# Patient Record
Sex: Female | Born: 1965 | Smoking: Never smoker
Health system: Southern US, Community
[De-identification: ages and names within clinical notes are randomized; demographics above are authoritative.]

## PROBLEM LIST (undated history)

## (undated) DIAGNOSIS — E079 Disorder of thyroid, unspecified: Secondary | ICD-10-CM

## (undated) DIAGNOSIS — F419 Anxiety disorder, unspecified: Secondary | ICD-10-CM

## (undated) DIAGNOSIS — R87629 Unspecified abnormal cytological findings in specimens from vagina: Secondary | ICD-10-CM

## (undated) DIAGNOSIS — F32A Depression, unspecified: Secondary | ICD-10-CM

## (undated) DIAGNOSIS — F329 Major depressive disorder, single episode, unspecified: Secondary | ICD-10-CM

## (undated) HISTORY — PX: SHOULDER SURGERY: SHX246

## (undated) HISTORY — DX: Unspecified abnormal cytological findings in specimens from vagina: R87.629

## (undated) HISTORY — PX: TUBAL LIGATION: SHX77

## (undated) HISTORY — DX: Disorder of thyroid, unspecified: E07.9

## (undated) HISTORY — DX: Depression, unspecified: F32.A

## (undated) HISTORY — PX: ABLATION: SHX5711

## (undated) HISTORY — DX: Anxiety disorder, unspecified: F41.9

---
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
## 2019-04-20 DIAGNOSIS — E039 Hypothyroidism, unspecified: Secondary | ICD-10-CM

## 2019-04-20 DIAGNOSIS — R1031 Right lower quadrant pain: Secondary | ICD-10-CM | POA: Diagnosis not present

## 2019-04-20 DIAGNOSIS — Z7689 Persons encountering health services in other specified circumstances: Secondary | ICD-10-CM

## 2019-04-20 DIAGNOSIS — E559 Vitamin D deficiency, unspecified: Secondary | ICD-10-CM

## 2019-04-20 DIAGNOSIS — Z1231 Encounter for screening mammogram for malignant neoplasm of breast: Secondary | ICD-10-CM | POA: Diagnosis not present

## 2019-04-20 DIAGNOSIS — R1032 Left lower quadrant pain: Secondary | ICD-10-CM

## 2019-04-20 DIAGNOSIS — Z8639 Personal history of other endocrine, nutritional and metabolic disease: Secondary | ICD-10-CM

## 2019-04-20 DIAGNOSIS — F418 Other specified anxiety disorders: Secondary | ICD-10-CM

## 2019-04-20 DIAGNOSIS — Z0000 Encounter for general adult medical examination without abnormal findings: Secondary | ICD-10-CM

## 2019-04-20 NOTE — Assessment & Plan Note (Signed)
Dicyclomine 10 mg 3 times daily before meals as needed.  Due for colonoscopy.  Refer to GI.

## 2019-04-20 NOTE — Assessment & Plan Note (Addendum)
Continue levothyroxine 150 mg daily. Checking TSH today.

## 2019-04-20 NOTE — Assessment & Plan Note (Signed)
Checking vitamin D level.  

## 2019-04-20 NOTE — Progress Notes (Signed)
New Patient Office Visit  Subjective:  Patient ID: Rebecca Olsen, female    DOB: 16-Aug-1965  Age: 54 y.o. MRN: [id number]  CC:  Chief Complaint  Patient presents with  . Establish Care  . Abdominal Pain    HPI [name] presents to establish care and discuss lower abdominal pain/cramping.  Establish care: Patient recently moved from [place] to [place].  We have requested records from her prior provider.  Anxiety/depression: Her husband passed away last 10-17-2022 and she reports an increase in anxiety since then.  Taking Seroquel 300 mg XR nightly.  Has some days with increased anxiety and others where she is fine.  Feels that she would like to have medication on hand to take as needed for anxiety. No SI/HI.  Hypothyroidism: Reports that she was told once that she has Hashimoto's thyroiditis.  Taking levothyroxine 150 mcg daily.  Unsure when last TSH was checked.  Abdominal pain/cramping: Complains of lower abdomen pain and cramping approximately 10 to 15 minutes after eating, accompanied by nausea, starting after her husband passed away last summer.  Significant nausea after eating but no vomiting.  Pain described as 5 out of 10, cramping in bilateral lower quadrants.  Has tried over-the-counter Imodium, taking up to 6 doses per day, mild to moderate relief.  No history of GERD.  Reporting at least 3 loose stools per day.  Small amount of bright red blood noticed yesterday and the day before but none today.  No appetite changes but does skip breakfast, snacks at lunch, and eats a full dinner.  Past Medical History:  Diagnosis Date  . Anxiety   . Depression     Past Surgical History:  Procedure Laterality Date  . ABLATION    . SHOULDER SURGERY Bilateral   . TUBAL LIGATION      Family History  Problem Relation Age of Onset  . Diabetes Mother   . Diabetes Father   . Hypertension Maternal Grandmother   . Diabetes Maternal Grandmother   . Hypertension Maternal Grandfather    . Heart attack Maternal Grandfather   . Diabetes Maternal Grandfather   . Hypertension Paternal Grandmother   . Diabetes Paternal Grandmother   . Hypertension Paternal Grandfather   . Diabetes Paternal Grandfather   . Stroke Paternal Grandfather   . Cancer Paternal Grandfather     Social History   Socioeconomic History  . Marital status: Widowed    Spouse name: Not on file  . Number of children: Not on file  . Years of education: Not on file  . Highest education level: Not on file  Occupational History  . Occupation: Librarian, academic  Tobacco Use  . Smoking status: Never Smoker  . Smokeless tobacco: Never Used  Substance and Sexual Activity  . Alcohol use: Yes    Alcohol/week: 1.0 - 2.0 standard drinks    Types: 1 - 2 Shots of liquor per week  . Drug use: Never  . Sexual activity: Yes    Partners: Male    Birth control/protection: Surgical    Comment: tubal ligation, ablation  Other Topics Concern  . Not on file  Social History Narrative  . Not on file   Social Determinants of Health   Financial Resource Strain:   . Difficulty of Paying Living Expenses: Not on file  Food Insecurity:   . Worried About Charity fundraiser in the Last Year: Not on file  . Ran Out of Food in the Last Year: Not on file  Transportation  Needs:   . Lack of Transportation (Medical): Not on file  . Lack of Transportation (Non-Medical): Not on file  Physical Activity:   . Days of Exercise per Week: Not on file  . Minutes of Exercise per Session: Not on file  Stress:   . Feeling of Stress : Not on file  Social Connections:   . Frequency of Communication with Friends and Family: Not on file  . Frequency of Social Gatherings with Friends and Family: Not on file  . Attends Religious Services: Not on file  . Active Member of Clubs or Organizations: Not on file  . Attends Archivist Meetings: Not on file  . Marital Status: Not on file  Intimate Partner Violence:   . Fear of Current  or Ex-Partner: Not on file  . Emotionally Abused: Not on file  . Physically Abused: Not on file  . Sexually Abused: Not on file    ROS Review of Systems  Constitutional: Negative for chills, fatigue, fever and unexpected weight change.  HENT: Negative for congestion, rhinorrhea and sore throat.   Respiratory: Negative for cough, chest tightness, shortness of breath and wheezing.   Cardiovascular: Negative for chest pain, palpitations and leg swelling.  Gastrointestinal: Positive for abdominal distention, abdominal pain, blood in stool, diarrhea and nausea. Negative for vomiting.  Genitourinary: Negative for dysuria, frequency and urgency.  Neurological: Negative for dizziness, light-headedness and headaches.  Psychiatric/Behavioral: Negative for self-injury, sleep disturbance and suicidal ideas. The patient is nervous/anxious.     Objective:   Today's Vitals: BP (!) 144/87   Pulse 85   Temp 98.4 F (36.9 C) (Oral)   Ht 5\' 1"  (1.549 m)   Wt 126 lb 12.8 oz (57.5 kg)   SpO2 100%   BMI 23.96 kg/m   Physical Exam Vitals reviewed.  Constitutional:      General: She is not in acute distress.    Appearance: Normal appearance.  HENT:     Head: Normocephalic and atraumatic.  Cardiovascular:     Rate and Rhythm: Normal rate and regular rhythm.     Pulses: Normal pulses.     Heart sounds: Normal heart sounds. No murmur. No friction rub. No gallop.   Pulmonary:     Effort: Pulmonary effort is normal. No respiratory distress.     Breath sounds: Normal breath sounds.  Abdominal:     General: Bowel sounds are normal.     Palpations: Abdomen is soft.  Skin:    General: Skin is warm and dry.  Neurological:     Mental Status: She is alert and oriented to person, place, and time.  Psychiatric:        Mood and Affect: Mood normal.        Behavior: Behavior normal.        Thought Content: Thought content normal.        Judgment: Judgment normal.     Assessment & Plan:   Problem  List Items Addressed This Visit      Endocrine   Hypothyroidism    Continue levothyroxine 150 mg daily. Checking TSH today.      Relevant Orders   TSH     Other   Bilateral lower abdominal cramping    Dicyclomine 10 mg 3 times daily before meals as needed.  Due for colonoscopy.  Refer to GI.      Relevant Medications   dicyclomine (BENTYL) 10 MG capsule   Other Relevant Orders   CBC   Ambulatory referral  to Gastroenterology   Encounter for screening mammogram for malignant neoplasm of breast    Mammogram ordered      Relevant Orders   MM 3D SCREEN BREAST BILATERAL   Preventative health care    Checking CBC, CMP, lipids.  We will plan to return for Pap smear/CPE.      Relevant Orders   CBC   COMPLETE METABOLIC PANEL WITH GFR   Lipid panel   History of vitamin D deficiency    Checking vitamin D level.      Relevant Orders   VITAMIN D 25 Hydroxy (Vit-D Deficiency, Fractures)   Depression with anxiety    Continue Seroquel nightly.  Will discuss adjuncts to address anxiety once lab results are back.       Other Visit Diagnoses    Encounter to establish care    -  Primary      Outpatient Encounter Medications as of 04/20/2019  Medication Sig  . levothyroxine (SYNTHROID) 150 MCG tablet Take 150 mcg by mouth daily before breakfast.  . QUEtiapine (SEROQUEL XR) 300 MG 24 hr tablet Take 300 mg by mouth at bedtime.  . dicyclomine (BENTYL) 10 MG capsule Take 1 capsule (10 mg total) by mouth 3 (three) times daily before meals.   No facility-administered encounter medications on file as of 04/20/2019.    Follow-up: Return in about 6 weeks (around 06/01/2019) for CPE/Pap smear.   Clearnce Sorrel, DNP, APRN, FNP-BC [facility]

## 2019-04-20 NOTE — Assessment & Plan Note (Signed)
Continue Seroquel nightly.  Will discuss adjuncts to address anxiety once lab results are back.

## 2019-04-20 NOTE — Assessment & Plan Note (Signed)
Mammogram ordered

## 2019-04-20 NOTE — Assessment & Plan Note (Signed)
Checking CBC, CMP, lipids.  We will plan to return for Pap smear/CPE.

## 2019-04-23 DIAGNOSIS — F418 Other specified anxiety disorders: Secondary | ICD-10-CM

## 2019-04-23 NOTE — Addendum Note (Signed)
Addended bySamuel Bouche on: 04/23/2019 08:14 AM   Modules accepted: Orders

## 2019-04-23 NOTE — Telephone Encounter (Signed)
Received fax from [employer] that Seroquel requires a PA. Information has been sent to the insurance company. Awaiting determination.

## 2019-04-24 NOTE — Telephone Encounter (Signed)
Received fax for Medication exception on Quetiapine ER  It has been approved as of 04/22/20.  Valid: 04/23/19 - 04/22/20 - CF

## 2019-05-02 DIAGNOSIS — R197 Diarrhea, unspecified: Secondary | ICD-10-CM

## 2019-05-02 DIAGNOSIS — K921 Melena: Secondary | ICD-10-CM

## 2019-05-02 DIAGNOSIS — R110 Nausea: Secondary | ICD-10-CM

## 2019-05-02 DIAGNOSIS — R109 Unspecified abdominal pain: Secondary | ICD-10-CM

## 2019-05-02 DIAGNOSIS — Z01818 Encounter for other preprocedural examination: Secondary | ICD-10-CM | POA: Diagnosis not present

## 2019-05-02 DIAGNOSIS — E559 Vitamin D deficiency, unspecified: Secondary | ICD-10-CM

## 2019-05-02 DIAGNOSIS — R1030 Lower abdominal pain, unspecified: Secondary | ICD-10-CM | POA: Diagnosis not present

## 2019-05-02 DIAGNOSIS — R194 Change in bowel habit: Secondary | ICD-10-CM

## 2019-05-02 NOTE — Patient Instructions (Addendum)
You have been scheduled for an endoscopy and colonoscopy. Please follow the written instructions given to you at your visit today. Please pick up your prep supplies at the pharmacy within the next 1-3 days. If you use inhalers (even only as needed), please bring them with you on the day of your procedure. Your physician has requested that you go to www.startemmi.com and enter the access code given to you at your visit today. This web site gives a general overview about your procedure. However, you should still follow specific instructions given to you by our office regarding your preparation for the procedure.  Your provider has requested that you go to the basement level for lab work at [facility] in [place] [address]. Press "B" on the elevator. The lab is located at the first door on the left as you exit the elevator.  It was a pleasure to see you today!  Vito Cirigliano, D.O.

## 2019-05-04 DIAGNOSIS — R21 Rash and other nonspecific skin eruption: Secondary | ICD-10-CM | POA: Diagnosis not present

## 2019-05-04 DIAGNOSIS — R350 Frequency of micturition: Secondary | ICD-10-CM

## 2019-05-04 DIAGNOSIS — R300 Dysuria: Secondary | ICD-10-CM

## 2019-05-04 DIAGNOSIS — R197 Diarrhea, unspecified: Secondary | ICD-10-CM

## 2019-05-04 NOTE — Telephone Encounter (Signed)
Sarie called and left a message stating she has a rash on her stomach. I called and left a message for patient to call back to schedule a virtual visit.

## 2019-05-07 NOTE — Telephone Encounter (Signed)
Rebecca Olsen called and left a message stating she was wanting to stop taking one of her medications. She would like to stop the Seroquel and start a medication for insomnia. She rather not take a depression medication for sleep.

## 2019-05-08 DIAGNOSIS — G4700 Insomnia, unspecified: Secondary | ICD-10-CM

## 2019-05-15 DIAGNOSIS — Z1159 Encounter for screening for other viral diseases: Secondary | ICD-10-CM

## 2019-05-18 DIAGNOSIS — K635 Polyp of colon: Secondary | ICD-10-CM

## 2019-05-18 DIAGNOSIS — K640 First degree hemorrhoids: Secondary | ICD-10-CM

## 2019-05-18 DIAGNOSIS — D122 Benign neoplasm of ascending colon: Secondary | ICD-10-CM

## 2019-05-18 DIAGNOSIS — R110 Nausea: Secondary | ICD-10-CM

## 2019-05-18 DIAGNOSIS — R1030 Lower abdominal pain, unspecified: Secondary | ICD-10-CM | POA: Diagnosis present

## 2019-05-18 DIAGNOSIS — K2951 Unspecified chronic gastritis with bleeding: Secondary | ICD-10-CM | POA: Diagnosis not present

## 2019-05-18 DIAGNOSIS — K2970 Gastritis, unspecified, without bleeding: Secondary | ICD-10-CM | POA: Diagnosis not present

## 2019-05-18 DIAGNOSIS — R197 Diarrhea, unspecified: Secondary | ICD-10-CM | POA: Diagnosis not present

## 2019-05-18 DIAGNOSIS — E559 Vitamin D deficiency, unspecified: Secondary | ICD-10-CM

## 2019-05-18 DIAGNOSIS — K921 Melena: Secondary | ICD-10-CM

## 2019-05-18 DIAGNOSIS — K6389 Other specified diseases of intestine: Secondary | ICD-10-CM | POA: Diagnosis not present

## 2019-05-18 NOTE — Op Note (Signed)
[facility] Patient Name: Rebecca Olsen Procedure Date: 05/18/2019 3:02 PM MRN: [id number] Endoscopist: Gerrit Heck , MD Age: 54 Referring MD:  Date of Birth: 08/27/1965 Gender: Female Account #: [id number] Procedure:                Upper GI endoscopy Indications:              Lower abdominal pain/cramping, Diarrhea, Nausea,                            Vitamin D deficiency and prior history of iron                            deficiency without anemia. Medicines:                Monitored Anesthesia Care Procedure:                Pre-Anesthesia Assessment:                           - Prior to the procedure, a History and Physical                            was performed, and patient medications and                            allergies were reviewed. The patient's tolerance of                            previous anesthesia was also reviewed. The risks                            and benefits of the procedure and the sedation                            options and risks were discussed with the patient.                            All questions were answered, and informed consent                            was obtained. Prior Anticoagulants: The patient has                            taken no previous anticoagulant or antiplatelet                            agents. ASA Grade Assessment: II - A patient with                            mild systemic disease. After reviewing the risks                            and benefits, the patient was deemed in  satisfactory condition to undergo the procedure.                           After obtaining informed consent, the endoscope was                            passed under direct vision. Throughout the                            procedure, the patient's blood pressure, pulse, and                            oxygen saturations were monitored continuously. The                            Endoscope was introduced through  the mouth, and                            advanced to the second part of duodenum. The upper                            GI endoscopy was accomplished without difficulty.                            The patient tolerated the procedure well. Scope In: Scope Out: Findings:                 The examined esophagus was normal.                           Scattered minimal inflammation characterized by                            erythema was found in the gastric body and in the                            gastric antrum. Biopsies were taken with a cold                            forceps for Helicobacter pylori testing. Estimated                            blood loss was minimal.                           The duodenal bulb, first portion of the duodenum                            and second portion of the duodenum were normal.                            Biopsies for histology were taken with a cold  forceps for evaluation of celiac disease. Estimated                            blood loss was minimal. Complications:            No immediate complications. Estimated Blood Loss:     Estimated blood loss was minimal. Impression:               - Normal esophagus.                           - Gastritis. Biopsied.                           - Normal duodenal bulb, first portion of the                            duodenum and second portion of the duodenum.                            Biopsied. Recommendation:           - Patient has a contact number available for                            emergencies. The signs and symptoms of potential                            delayed complications were discussed with the                            patient. Return to normal activities tomorrow.                            Written discharge instructions were provided to the                            patient.                           - Resume previous diet.                           - Continue  present medications.                           - Await pathology results.                           - Perform a colonoscopy today. Gerrit Heck, MD 05/18/2019 3:55:55 PM

## 2019-05-18 NOTE — Progress Notes (Signed)
A and O x3. Report to RN. Tolerated MAC anesthesia well.Teeth unchanged after procedure.

## 2019-05-18 NOTE — Op Note (Signed)
[facility] Patient Name: Rebecca Olsen Procedure Date: 05/18/2019 3:01 PM MRN: [id number] Endoscopist: Gerrit Heck , MD Age: 54 Referring MD:  Date of Birth: 02-17-1966 Gender: Female Account #: [id number] Procedure:                Colonoscopy Indications:              Lower abdominal pain/Cramping, Hematochezia,                            Diarrhea, Hx of iron deficiency without anemia Medicines:                Monitored Anesthesia Care Procedure:                Pre-Anesthesia Assessment:                           - Prior to the procedure, a History and Physical                            was performed, and patient medications and                            allergies were reviewed. The patient's tolerance of                            previous anesthesia was also reviewed. The risks                            and benefits of the procedure and the sedation                            options and risks were discussed with the patient.                            All questions were answered, and informed consent                            was obtained. Prior Anticoagulants: The patient has                            taken no previous anticoagulant or antiplatelet                            agents. ASA Grade Assessment: II - A patient with                            mild systemic disease. After reviewing the risks                            and benefits, the patient was deemed in                            satisfactory condition to undergo the procedure.  After obtaining informed consent, the colonoscope                            was passed under direct vision. Throughout the                            procedure, the patient's blood pressure, pulse, and                            oxygen saturations were monitored continuously. The                            Colonoscope was introduced through the anus and                            advanced to the the  terminal ileum. The colonoscopy                            was performed without difficulty. The patient                            tolerated the procedure well. The quality of the                            bowel preparation was adequate. The terminal ileum,                            ileocecal valve, appendiceal orifice, and rectum                            were photographed. Scope In: 3:17:02 PM Scope Out: 3:33:17 PM Scope Withdrawal Time: 0 hours 13 minutes 19 seconds  Total Procedure Duration: 0 hours 16 minutes 15 seconds  Findings:                 The perianal and digital rectal examinations were                            normal.                           A 3 mm polyp was found in the ascending colon. The                            polyp was sessile. The polyp was removed with a                            cold biopsy forceps. Resection and retrieval were                            complete. Estimated blood loss was minimal.                           Non-bleeding internal hemorrhoids were found during  anoscopy. The hemorrhoids were small.                           Retroflexion in the rectum was not performed due to                            anatomy (narrow, short rectal vault). Anterograde                            views on slow withdrawal were otherwise normal                            appearing.                           Normal mucosa was found in the remainder of the                            colon. Biopsies for histology were taken with a                            cold forceps from the right colon and left colon                            for evaluation of microscopic colitis. Estimated                            blood loss was minimal.                           The terminal ileum appeared normal. Complications:            No immediate complications. Estimated Blood Loss:     Estimated blood loss was minimal. Impression:               - One 3  mm polyp in the ascending colon, removed                            with a cold biopsy forceps. Resected and retrieved.                           - Non-bleeding internal hemorrhoids.                           - Normal mucosa in the entire examined colon.                            Biopsied.                           - The examined portion of the ileum was normal. Recommendation:           - Patient has a contact number available for                            emergencies. The signs  and symptoms of potential                            delayed complications were discussed with the                            patient. Return to normal activities tomorrow.                            Written discharge instructions were provided to the                            patient.                           - Resume previous diet.                           - Continue present medications.                           - Await pathology results.                           - Repeat colonoscopy in 7-10 years for surveillance                            based on pathology results.                           - Return to GI office PRN.                           - Use fiber, for example Citrucel, Fibercon, Konsyl                            or Metamucil. Gerrit Heck, MD 05/18/2019 4:00:13 PM

## 2019-05-18 NOTE — Patient Instructions (Signed)
YOU HAD AN ENDOSCOPIC PROCEDURE TODAY AT THE [facility]:   Refer to the procedure report that was given to you for any specific questions about what was found during the examination.  If the procedure report does not answer your questions, please call your gastroenterologist to clarify.  If you requested that your care partner not be given the details of your procedure findings, then the procedure report has been included in a sealed envelope for you to review at your convenience later.  YOU SHOULD EXPECT: Some feelings of bloating in the abdomen. Passage of more gas than usual.  Walking can help get rid of the air that was put into your GI tract during the procedure and reduce the bloating. If you had a lower endoscopy (such as a colonoscopy or flexible sigmoidoscopy) you may notice spotting of blood in your stool or on the toilet paper. If you underwent a bowel prep for your procedure, you may not have a normal bowel movement for a few days.  Please Note:  You might notice some irritation and congestion in your nose or some drainage.  This is from the oxygen used during your procedure.  There is no need for concern and it should clear up in a day or so.  SYMPTOMS TO REPORT IMMEDIATELY:   Following lower endoscopy (colonoscopy or flexible sigmoidoscopy):  Excessive amounts of blood in the stool  Significant tenderness or worsening of abdominal pains  Swelling of the abdomen that is new, acute  Fever of 100F or higher   Following upper endoscopy (EGD)  Vomiting of blood or coffee ground material  New chest pain or pain under the shoulder blades  Painful or persistently difficult swallowing  New shortness of breath  Fever of 100F or higher  Black, tarry-looking stools  For urgent or emergent issues, a gastroenterologist can be reached at any hour by calling [phone]. Do not use MyChart messaging for urgent concerns.    DIET:  We do recommend a small meal at first, but  then you may proceed to your regular diet.  Drink plenty of fluids but you should avoid alcoholic beverages for 24 hours.  ACTIVITY:  You should plan to take it easy for the rest of today and you should NOT DRIVE or use heavy machinery until tomorrow (because of the sedation medicines used during the test).    FOLLOW UP: Our staff will call the number listed on your records 48-72 hours following your procedure to check on you and address any questions or concerns that you may have regarding the information given to you following your procedure. If we do not reach you, we will leave a message.  We will attempt to reach you two times.  During this call, we will ask if you have developed any symptoms of COVID 19. If you develop any symptoms (ie: fever, flu-like symptoms, shortness of breath, cough etc.) before then, please call [phone].  If you test positive for Covid 19 in the 2 weeks post procedure, please call and report this information to us.    If any biopsies were taken you will be contacted by phone or by letter within the next 1-3 weeks.  Please call us at [phone] if you have not heard about the biopsies in 3 weeks.    SIGNATURES/CONFIDENTIALITY: You and/or your care partner have signed paperwork which will be entered into your electronic medical record.  These signatures attest to the fact that that the information above on   your After Visit Summary has been reviewed and is understood.  Full responsibility of the confidentiality of this discharge information lies with you and/or your care-partner. 

## 2019-05-18 NOTE — Progress Notes (Signed)
Called to room to assist during endoscopic procedure.  Patient ID and intended procedure confirmed with present staff. Received instructions for my participation in the procedure from the performing physician.  

## 2019-05-22 NOTE — Telephone Encounter (Signed)
  Follow up Call-  Call back number 05/18/2019  Post procedure Call Back phone  # [phone]  Permission to leave phone message Yes     Patient questions:  Do you have a fever, pain , or abdominal swelling? No. Pain Score  0 *  Have you tolerated food without any problems? Yes.    Have you been able to return to your normal activities? Yes.    Do you have any questions about your discharge instructions: Diet   No. Medications  No. Follow up visit  No.  Do you have questions or concerns about your Care? No.  Actions: * If pain score is 4 or above: 1. No action needed, pain <4.Have you developed a fever since your procedure? no  2.   Have you had an respiratory symptoms (SOB or cough) since your procedure? no  3.   Have you tested positive for COVID 19 since your procedure no  4.   Have you had any family members/close contacts diagnosed with the COVID 19 since your procedure?  no   If yes to any of these questions please route to Joylene John, RN and Alphonsa Gin, Therapist, sports.

## 2019-06-01 DIAGNOSIS — Z124 Encounter for screening for malignant neoplasm of cervix: Secondary | ICD-10-CM | POA: Diagnosis not present

## 2019-06-01 DIAGNOSIS — Z1231 Encounter for screening mammogram for malignant neoplasm of breast: Secondary | ICD-10-CM

## 2019-06-01 DIAGNOSIS — R87612 Low grade squamous intraepithelial lesion on cytologic smear of cervix (LGSIL): Secondary | ICD-10-CM

## 2019-06-01 DIAGNOSIS — Z0001 Encounter for general adult medical examination with abnormal findings: Secondary | ICD-10-CM | POA: Insufficient documentation

## 2019-06-01 DIAGNOSIS — T148XXA Other injury of unspecified body region, initial encounter: Secondary | ICD-10-CM

## 2019-06-01 NOTE — Progress Notes (Signed)
HPI: Rebecca Olsen is a 54 y.o. female who  has a past medical history of Anxiety, Depression, and Thyroid disease.  she presents to [facility] today, 06/01/19,  for chief complaint of: Annual physical exam with pap  Right shoulder-experiencing pain over superior aspect of right scapula, tender to palpation.  No upper extremity weakness or paresthesias.  Worse with exercise, slightly better with rest.  Interfering with sleep, wakes her when she rolls onto the right side.  Reports that she has cervical scoliosis according to a chiropractor she saw years ago.  Has tried ibuprofen, Aleve, Motrin with some relief.  Massage exacerbated pain due to tenderness.  Right knee-reports right knee swelling when she has been off her feet all day.  Intermittent right knee pain.  Previous evaluation showed no abnormalities.  Received physical therapy with no improvement.  GI-reports previous GI issues are much better.  No further bloody stools.  Had her colonoscopy completed and did well overall with this but does report some severe postprocedural nausea that has since resolved.  No longer taking Bentyl.  Past medical, surgical, social and family history reviewed:  Patient Active Problem List   Diagnosis Date Noted  . Insomnia 05/08/2019  . Diarrhea 05/04/2019  . Urinary frequency 05/04/2019  . Hypothyroidism 04/20/2019  . Bilateral lower abdominal cramping 04/20/2019  . Encounter for screening mammogram for malignant neoplasm of breast 04/20/2019  . Preventative health care 04/20/2019  . History of vitamin D deficiency 04/20/2019  . Depression with anxiety 04/20/2019    Past Surgical History:  Procedure Laterality Date  . ABLATION    . SHOULDER SURGERY Bilateral   . TUBAL LIGATION      Social History   Tobacco Use  . Smoking status: Never Smoker  . Smokeless tobacco: Never Used  Substance Use Topics  . Alcohol use: Yes    Alcohol/week: 1.0 - 2.0  standard drinks    Types: 1 - 2 Shots of liquor per week    Comment: Mixed drinks    Family History  Problem Relation Age of Onset  . Diabetes Mother   . Diabetes Father   . Hypertension Maternal Grandmother   . Diabetes Maternal Grandmother   . Hypertension Maternal Grandfather   . Heart attack Maternal Grandfather   . Diabetes Maternal Grandfather   . Hypertension Paternal Grandmother   . Diabetes Paternal Grandmother   . Hypertension Paternal Grandfather   . Diabetes Paternal Grandfather   . Stroke Paternal Grandfather   . Cancer Paternal Grandfather   . Colon cancer Neg Hx   . Esophageal cancer Neg Hx      Current medication list and allergy/intolerance information reviewed:    Current Outpatient Medications  Medication Sig Dispense Refill  . hydrOXYzine (ATARAX/VISTARIL) 25 MG tablet Take 1-2 tablets (25-50 mg total) by mouth at bedtime as needed (for sleep). 60 tablet 2  . levothyroxine (SYNTHROID) 175 MCG tablet Take 1 tablet (175 mcg total) by mouth daily before breakfast. 90 tablet 0  . QUEtiapine (SEROQUEL) 300 MG tablet Take 300 mg by mouth at bedtime.    . Vitamin D, Ergocalciferol, (DRISDOL) 1.25 MG (50000 UNIT) CAPS capsule Take 1 capsule (50,000 Units total) by mouth every 7 (seven) days. Take for 8 total doses(weeks) 8 capsule 0  . clotrimazole-betamethasone (LOTRISONE) cream Apply 1 application topically 2 (two) times daily. (Patient not taking: Reported on 06/01/2019) 45 g 0  . cyclobenzaprine (FLEXERIL) 10 MG tablet Take 1 tablet (10 mg total)  by mouth 3 (three) times daily as needed for muscle spasms. 30 tablet 0  . dicyclomine (BENTYL) 10 MG capsule Take 1 capsule (10 mg total) by mouth 3 (three) times daily before meals. (Patient not taking: Reported on 06/01/2019) 90 capsule 2  . meloxicam (MOBIC) 15 MG tablet Take 1 tab daily for 14 days and then 1 tab daily as needed. 30 tablet 0   No current facility-administered medications for this visit.    No Known  Allergies    Review of Systems:  Constitutional:  No  fever, no chills, No recent illness, No unintentional weight changes. No significant fatigue.   HEENT: No  headache, no vision change, no hearing change, No sore throat, No  sinus pressure  Cardiac: No  chest pain, No  pressure, No palpitations, No  Orthopnea  Respiratory:  No  shortness of breath. No  Cough  Gastrointestinal: No  abdominal pain, No  nausea, No  vomiting,  No  blood in stool, No  diarrhea, No  constipation   Musculoskeletal: + myalgia/arthralgia right shoulder, right knee  Skin: No  Rash, No other wounds/concerning lesions  Genitourinary: No  incontinence, No  abnormal genital bleeding, No abnormal genital discharge  Hem/Onc: No  easy bruising/bleeding, No  abnormal lymph node  Endocrine: No cold intolerance,  No heat intolerance. No polyuria/polydipsia/polyphagia   Neurologic: No  weakness, No  dizziness, No  slurred speech/focal weakness/facial droop  Psychiatric: No  concerns with depression, No  concerns with anxiety, No sleep problems, No mood problems  Pelvic exam: normal external genitalia, vulva, vagina, cervix, uterus and adnexa, VULVA: normal appearing vulva with no masses, tenderness or lesions, VAGINA: normal appearing vagina with normal color and discharge, no lesions, CERVIX: normal appearing cervix without discharge or lesions, UTERUS: uterus is normal size, shape, consistency and nontender, ADNEXA: normal adnexa in size, nontender and no masses, PAP: Pap smear done today, HPV test, exam chaperoned by Glennie Hawk, MA.    Exam:  BP (!) 145/81   Pulse 70   Temp 98.1 F (36.7 C) (Oral)   Ht 5' 1.5" (1.562 m)   Wt 132 lb 11.2 oz (60.2 kg)   SpO2 99%   BMI 24.67 kg/m   Constitutional: VS see above. General Appearance: alert, well-developed, well-nourished, NAD  Eyes: Normal lids and conjunctive, non-icteric sclera  Ears, Nose, Mouth, Throat: MMM, Normal external inspection  ears/nares/mouth/lips/gums. TM normal bilaterally. Pharynx/tonsils no erythema, no exudate. Nasal mucosa normal.   Neck: No masses, trachea midline. No thyroid enlargement. No tenderness/mass appreciated. No lymphadenopathy  Respiratory: Normal respiratory effort. no wheeze, no rhonchi, no rales  Cardiovascular: S1/S2 normal, no murmur, no rub/gallop auscultated. RRR. No lower extremity edema. Pedal pulse II/IV bilaterally DP and PT. No carotid bruit or JVD. No abdominal aortic bruit.  Gastrointestinal: Nontender, no masses. No hepatomegaly, no splenomegaly. No hernia appreciated. Bowel sounds normal. Rectal exam deferred.   Musculoskeletal: Gait normal. No clubbing/cyanosis of digits. + TTP superior right scapula.  Neurological: Normal balance/coordination. No tremor. No cranial nerve deficit on limited exam. Motor and sensation intact and symmetric. Cerebellar reflexes intact.   Skin: warm, dry, intact. No rash/ulcer. No concerning nevi or subq nodules on limited exam.    Psychiatric: Normal judgment/insight. Normal mood and affect. Oriented x3.    No results found for this or any previous visit (from the past 72 hour(s)).  No results found.   ASSESSMENT/PLAN:   1. Encounter for general adult medical examination with abnormal findings Up-to-date on vaccinations.  Most recent labs drawn 6 weeks ago.  We will recheck TSH today on new dose of Synthroid 175 mcg daily. - Cytology - PAP - TSH  2. Visit for screening mammogram Mammogram order in place.  Advised patient to call and reschedule mammogram at her convenience.  If necessary may need to resend referral for different location depending on schedule availability.  3. Cervical cancer screening Pap with HPV cotesting today. - Cytology - PAP  4. Muscle strain Meloxicam 1 tab daily x14 days then daily as needed.  Avoid ibuprofen/NSAIDs while taking this medication.  Flexeril 10 mg 3 times daily as needed.  May use ice, heat,  massage, and gentle stretches as tolerated.  If no improvement may benefit from following up with Dr. Darene Lamer for further evaluation. - meloxicam (MOBIC) 15 MG tablet; Take 1 tab daily for 14 days and then 1 tab daily as needed.  Dispense: 30 tablet; Refill: 0 - cyclobenzaprine (FLEXERIL) 10 MG tablet; Take 1 tablet (10 mg total) by mouth 3 (three) times daily as needed for muscle spasms.  Dispense: 30 tablet; Refill: 0   Orders Placed This Encounter  Procedures  . TSH    Meds ordered this encounter  Medications  . meloxicam (MOBIC) 15 MG tablet    Sig: Take 1 tab daily for 14 days and then 1 tab daily as needed.    Dispense:  30 tablet    Refill:  0    Order Specific Question:   Supervising Provider    Answer:   Emeterio Reeve [id number]  . cyclobenzaprine (FLEXERIL) 10 MG tablet    Sig: Take 1 tablet (10 mg total) by mouth 3 (three) times daily as needed for muscle spasms.    Dispense:  30 tablet    Refill:  0    Order Specific Question:   Supervising Provider    Answer:   Emeterio Reeve [phone]    Patient Instructions    Health Maintenance, Female Adopting a healthy lifestyle and getting preventive care are important in promoting health and wellness. Ask your health care provider about:  The right schedule for you to have regular tests and exams.  Things you can do on your own to prevent diseases and keep yourself healthy. What should I know about diet, weight, and exercise? Eat a healthy diet   Eat a diet that includes plenty of vegetables, fruits, low-fat dairy products, and lean protein.  Do not eat a lot of foods that are high in solid fats, added sugars, or sodium. Maintain a healthy weight Body mass index (BMI) is used to identify weight problems. It estimates body fat based on height and weight. Your health care provider can help determine your BMI and help you achieve or maintain a healthy weight. Get regular exercise Get regular exercise. This is one of the  most important things you can do for your health. Most adults should:  Exercise for at least 150 minutes each week. The exercise should increase your heart rate and make you sweat (moderate-intensity exercise).  Do strengthening exercises at least twice a week. This is in addition to the moderate-intensity exercise.  Spend less time sitting. Even light physical activity can be beneficial. Watch cholesterol and blood lipids Have your blood tested for lipids and cholesterol at 52 years of age, then have this test every 5 years. Have your cholesterol levels checked more often if:  Your lipid or cholesterol levels are high.  You are older than 52 years of age.  You are at high risk for heart disease. What should I know about cancer screening? Depending on your health history and family history, you may need to have cancer screening at various ages. This may include screening for:  Breast cancer.  Cervical cancer.  Colorectal cancer.  Skin cancer.  Lung cancer. What should I know about heart disease, diabetes, and high blood pressure? Blood pressure and heart disease  High blood pressure causes heart disease and increases the risk of stroke. This is more likely to develop in people who have high blood pressure readings, are of African descent, or are overweight.  Have your blood pressure checked: ? Every 3-5 years if you are 81-38 years of age. ? Every year if you are 75 years old or older. Diabetes Have regular diabetes screenings. This checks your fasting blood sugar level. Have the screening done:  Once every three years after age 10 if you are at a normal weight and have a low risk for diabetes.  More often and at a younger age if you are overweight or have a high risk for diabetes. What should I know about preventing infection? Hepatitis B If you have a higher risk for hepatitis B, you should be screened for this virus. Talk with your health care provider to find out if  you are at risk for hepatitis B infection. Hepatitis C Testing is recommended for:  Everyone born from [birth year] through 1963.  Anyone with known risk factors for hepatitis C. Sexually transmitted infections (STIs)  Get screened for STIs, including gonorrhea and chlamydia, if: ? You are sexually active and are younger than 52 years of age. ? You are older than 52 years of age and your health care provider tells you that you are at risk for this type of infection. ? Your sexual activity has changed since you were last screened, and you are at increased risk for chlamydia or gonorrhea. Ask your health care provider if you are at risk.  Ask your health care provider about whether you are at high risk for HIV. Your health care provider may recommend a prescription medicine to help prevent HIV infection. If you choose to take medicine to prevent HIV, you should first get tested for HIV. You should then be tested every 3 months for as long as you are taking the medicine. Pregnancy  If you are about to stop having your period (premenopausal) and you may become pregnant, seek counseling before you get pregnant.  Take 400 to 800 micrograms (mcg) of folic acid every day if you become pregnant.  Ask for birth control (contraception) if you want to prevent pregnancy. Osteoporosis and menopause Osteoporosis is a disease in which the bones lose minerals and strength with aging. This can result in bone fractures. If you are 62 years old or older, or if you are at risk for osteoporosis and fractures, ask your health care provider if you should:  Be screened for bone loss.  Take a calcium or vitamin D supplement to lower your risk of fractures.  Be given hormone replacement therapy (HRT) to treat symptoms of menopause. Follow these instructions at home: Lifestyle  Do not use any products that contain nicotine or tobacco, such as cigarettes, e-cigarettes, and chewing tobacco. If you need help quitting, ask  your health care provider.  Do not use street drugs.  Do not share needles.  Ask your health care provider for help if you need support or information about quitting drugs. Alcohol use  Do  not drink alcohol if: ? Your health care provider tells you not to drink. ? You are pregnant, may be pregnant, or are planning to become pregnant.  If you drink alcohol: ? Limit how much you use to 0-1 drink a day. ? Limit intake if you are breastfeeding.  Be aware of how much alcohol is in your drink. In the [place] one drink equals one 12 oz bottle of beer (355 mL), one 5 oz glass of wine (148 mL), or one 1 oz glass of hard liquor (44 mL). General instructions  Schedule regular health, dental, and eye exams.  Stay current with your vaccines.  Tell your health care provider if: ? You often feel depressed. ? You have ever been abused or do not feel safe at home. Summary  Adopting a healthy lifestyle and getting preventive care are important in promoting health and wellness.  Follow your health care provider's instructions about healthy diet, exercising, and getting tested or screened for diseases.  Follow your health care provider's instructions on monitoring your cholesterol and blood pressure. This information is not intended to replace advice given to you by your health care provider. Make sure you discuss any questions you have with your health care provider. Document Revised: 02/22/2018 Document Reviewed: 02/22/2018 Elsevier Patient Education  [place].  Follow-up plan: Return in 1 year (on 05/31/2020) for Annual physical exam with labs.  Clearnce Sorrel, DNP, APRN, FNP-BC [facility]

## 2019-06-05 NOTE — Addendum Note (Signed)
Addended bySamuel Bouche on: 06/05/2019 04:03 PM   Modules accepted: Orders

## 2019-06-13 DIAGNOSIS — J069 Acute upper respiratory infection, unspecified: Secondary | ICD-10-CM | POA: Diagnosis not present

## 2019-06-13 NOTE — Progress Notes (Signed)
Virtual Visit via Video Note  I connected with Rebecca Olsen on 06/13/19 at  8:10 AM EDT by a video enabled telemedicine application and verified that I am speaking with the correct person using two identifiers.   I discussed the limitations of evaluation and management by telemedicine and the availability of in person appointments. The patient expressed understanding and agreed to proceed.  Subjective:    CC: Upper respiratory symptoms  HPI: Pleasant 54 year old female presenting today via MyChart video visit.  She reports 3 days of upper respiratory symptoms including low-grade fever, headache, sinus pressure, sinus congestion, dry cough, and sore throat.  Has been taking DayQuil/NyQuil and OTC sinus headache medication with moderate relief.  Eating and drinking without difficulty.  2 known sick contacts in the house.  Reports gradual onset of symptoms.  Does not think that this is Covid related.   Past medical history, Surgical history, Family history not pertinant except as noted below, Social history, Allergies, and medications have been entered into the medical record, reviewed, and corrections made.   Review of Systems: No fevers, chills, night sweats, weight loss, chest pain, or shortness of breath.   Objective:    General: Speaking clearly in complete sentences without any shortness of breath.  Alert and oriented x3.  Normal judgment. No apparent acute distress.    Impression and Recommendations:    1. Viral URI with cough Recommend Covid testing.  Discussed symptomatic care using OTC remedies.  See below for list.  Work note requested and provided via [place].  If no improvement by Monday, patient instructed to call back for further evaluation.  I discussed the assessment and treatment plan with the patient. The patient was provided an opportunity to ask questions and all were answered. The patient agreed with the plan and demonstrated an understanding of the instructions.    The patient was advised to call back or seek an in-person evaluation if the symptoms worsen or if the condition fails to improve as anticipated.  Return if symptoms worsen or fail to improve.  20 minutes of non-face-to-face time was provided during this encounter.   Clearnce Sorrel, DNP, APRN, FNP-BC [facility]  Medications & Home Remedies for Upper Respiratory Illness   Note: the following list assumes no pregnancy, normal liver & kidney function and no other drug interactions. Samuel Bouche, FNP has highlighted medications which are safe for you to use, but these may not be appropriate for everyone. Always ask a pharmacist or qualified medical provider if you have any questions!    Aches/Pains, Fever, Headache OTC Acetaminophen (Tylenol) 500 mg tablets - take max 2 tablets (1000 mg) every 6 hours (4 times per day)  OTC Ibuprofen (Motrin) 200 mg tablets - take max 4 tablets (800 mg) every 6 hours*   Sinus Congestion OTC Nasal Saline if desired to rinse OTC Oxymetolazone (Afrin, others) sparing use due to rebound congestion, NEVER use in kids OTC Phenylephrine (Sudafed) 10 mg tablets every 4 hours (or the 12-hour formulation)* OTC Diphenhydramine (Benadryl) 25 mg tablets - take max 2 tablets every 4 hours   Cough & Sore Throat OTC Dextromethorphan (Robitussin, others) - cough suppressant OTC Guaifenesin (Robitussin, Mucinex, others) - expectorant (helps cough up mucus) (Dextromethorphan and Guaifenesin also come in a combination tablet/syrup) OTC Lozenges w/ Benzocaine + Menthol (Cepacol) Honey - as much as you want! Teas which "coat the throat" - look for ingredients Elm Bark, Licorice Root, Marshmallow Root  Other OTC Zinc Lozenges within 24 hours of symptoms onset - mixed evidence this shortens the duration of the common cold Don't waste your money on Vitamin C or Echinacea in acute illness - it's already too late!

## 2019-06-13 NOTE — Patient Instructions (Signed)
  Place upper respiratory infection patient instructions here.

## 2019-06-21 DIAGNOSIS — R87612 Low grade squamous intraepithelial lesion on cytologic smear of cervix (LGSIL): Secondary | ICD-10-CM | POA: Diagnosis not present

## 2019-06-21 NOTE — Progress Notes (Signed)
54 yo here for colposcopy. Patient with LGSIL 05/2019 Patient given informed consent, signed copy in the chart, time out was performed.  Placed in lithotomy position. Cervix viewed with speculum and colposcope after application of acetic acid.   Colposcopy adequate?  yes Acetowhite lesions? 8 o'clock Punctation? no Mosaicism?  no Abnormal vasculature?  no Biopsies? 8 o'clock ECC? yes  COMMENTS:  Patient was given post procedure instructions.  She will return in 2 weeks for results.  Mora Bellman, MD

## 2019-08-30 DIAGNOSIS — G44209 Tension-type headache, unspecified, not intractable: Secondary | ICD-10-CM

## 2019-08-30 DIAGNOSIS — M542 Cervicalgia: Secondary | ICD-10-CM

## 2019-08-30 DIAGNOSIS — R635 Abnormal weight gain: Secondary | ICD-10-CM | POA: Diagnosis not present

## 2019-08-30 NOTE — Progress Notes (Signed)
Subjective:    CC: headache, neck pain, weight gain  HPI: Very pleasant 54 year old female presenting today to discuss headache, neck pain, and weight gain.  Headache-she reports a severe headache that started 2 days ago located behind the eyes and described as constant, aching, and stabbing.  She reports she had nausea and photophobia/phonophobia but no vomiting, fever, vision changes, weakness/numbness, altered mental status, dizziness, or aura noted.  She notes that she has had increased stress lately between work and moving into a new home.  She and her significant other moved in together over the weekend which preempted her headache.  She reports she has never had a headache like this and felt that it may have been a migraine.  No personal or family history of migraines.  She has been taking ibuprofen 800 mg up to 3 times a day with minimal relief.  Went home from work early today and was able to take a nap for several hours.  Since she woke up she reports that her headache is better, stating that she can still feel it mildly but it just barely there.  She is unsure if she is starting to have migraines now or if this may be related to her chronic neck and shoulder pain.  Also notes that she has been having some vision changes over the past couple of years that have required her to use reading glasses.  Overdue for eye exam.  Neck/shoulder pain-long history of chronic right neck and shoulder pain.  She does have abnormal curvature of the cervical spine and this is caused her significant discomfort over the years.  She has seen a chiropractor in the past years.  No recent imaging of her neck.  Reports that the musculature of the cervical spine and upper right trapezius remain tense, tender to touch.  Range of motion of neck is limited by pain and she frequently finds herself tilting her head forward and to the left to compensate for her discomfort.  Has recently been experiencing numbness and tingling  in her hands although she does admit to having carpal tunnel and not wearing her braces regularly.  Has tried anti-inflammatories and muscle relaxers with little relief.  Weight gain-has been struggling with weight gain in the past few months.  She reports that she always feels hungry and finds herself eating frequently in between mealtimes.  She does work in [employer] so this does not help with her cravings.  She is interested in something that will reduce her cravings and appetite.  I reviewed the past medical history, family history, social history, surgical history, and allergies today and no changes were needed.  Please see the problem list section below in epic for further details.  Past Medical History: Past Medical History:  Diagnosis Date  . Anxiety   . Depression   . Thyroid disease   . Vaginal Pap smear, abnormal    Past Surgical History: Past Surgical History:  Procedure Laterality Date  . ABLATION    . SHOULDER SURGERY Bilateral   . TUBAL LIGATION     Social History: Social History   Socioeconomic History  . Marital status: Widowed    Spouse name: Not on file  . Number of children: 2  . Years of education: Not on file  . Highest education level: Not on file  Occupational History  . Occupation: Librarian, academic  Tobacco Use  . Smoking status: Never Smoker  . Smokeless tobacco: Never Used  Vaping Use  . Vaping Use:  Never used  Substance and Sexual Activity  . Alcohol use: Yes    Alcohol/week: 1.0 - 2.0 standard drink    Types: 1 - 2 Shots of liquor per week    Comment: Mixed drinks  . Drug use: Never  . Sexual activity: Yes    Partners: Male    Birth control/protection: Surgical    Comment: tubal ligation, ablation  Other Topics Concern  . Not on file  Social History Narrative  . Not on file   Social Determinants of Health   Financial Resource Strain:   . Difficulty of Paying Living Expenses:   Food Insecurity:   . Worried About Charity fundraiser in the  Last Year:   . Arboriculturist in the Last Year:   Transportation Needs:   . Film/video editor (Medical):   Marland Kitchen Lack of Transportation (Non-Medical):   Physical Activity:   . Days of Exercise per Week:   . Minutes of Exercise per Session:   Stress:   . Feeling of Stress :   Social Connections:   . Frequency of Communication with Friends and Family:   . Frequency of Social Gatherings with Friends and Family:   . Attends Religious Services:   . Active Member of Clubs or Organizations:   . Attends Archivist Meetings:   Marland Kitchen Marital Status:    Family History: Family History  Problem Relation Age of Onset  . Diabetes Mother   . Diabetes Father   . Hypertension Maternal Grandmother   . Diabetes Maternal Grandmother   . Hypertension Maternal Grandfather   . Heart attack Maternal Grandfather   . Diabetes Maternal Grandfather   . Hypertension Paternal Grandmother   . Diabetes Paternal Grandmother   . Hypertension Paternal Grandfather   . Diabetes Paternal Grandfather   . Stroke Paternal Grandfather   . Cancer Paternal Grandfather   . Colon cancer Neg Hx   . Esophageal cancer Neg Hx    Allergies: No Known Allergies Medications: See med rec.  Review of Systems: No fevers, chills, night sweats, weight loss, chest pain, or shortness of breath.   Objective:    General: Well Developed, well nourished, and in no acute distress.  Neuro: Alert and oriented x3.  HEENT: Normocephalic, atraumatic.  Skin: Warm and dry. Cardiac: Regular rate and rhythm, no murmurs rubs or gallops, no lower extremity edema.  Respiratory: Clear to auscultation bilaterally. Not using accessory muscles, speaking in full sentences. MSK: Left paraspinal muscles at the cervical level going down into the upper trapezius of the right shoulder very tense, tender to palpation, warm.  Neck range of motion limited by pain in all planes.  Impression and Recommendations:    1. Acute non intractable  tension-type headache As her headache seems to be resolving, advised the patient to monitor for future headaches with the symptoms.  Feel this may be a simple tension headache related to increased stress and moving but cannot rule out possible migraine.  Advised the patient to notify the office if she begins to have more frequent headaches and we will evaluate further and consider a migraine preventative.  In the meantime she may continue taking OTC remedies such as ibuprofen or Excedrin Migraine to see if these help.  Patient agreeable to the plan.  2. Neck pain As this has been an ongoing issue, we will do a 5-day burst of prednisone to reduce the inflammation quickly but I would really like for her to follow-up with Dr.  T in about a week for further evaluation and treatment. - DG Cervical Spine Complete; Future  3. Weight gain Discussed weight loss options.  Wellbutrin may be helpful in a situation like this but apparently the patient has tried this in the past and could not tolerate it due to her high levels of anxiety.  We will try phentermine for a few weeks to see if she is able to tolerate this.  We will do one half tab to see if that helps with her cravings and grazing habit throughout the day.  Return in about 1 week (around 09/06/2019) for for neck pain with Dr. Darene Lamer; follow up in 4 weeks with me for weight check. ___________________________________________ Clearnce Sorrel, DNP, APRN, FNP-BC Primary Care and [place]

## 2019-09-03 NOTE — Telephone Encounter (Signed)
Our appointment last week was for headache, neck pain, and weight gain. Since I will need more information to determine if an antibiotic is appropriate, she will need a visit. A virtual is fine and if she wants to do that today, you can add her to a spot on the schedule and I will work her in.

## 2019-09-03 NOTE — Telephone Encounter (Signed)
Left a message for a return call.

## 2019-09-03 NOTE — Telephone Encounter (Signed)
[place] states she has been taking NyQuil/DayQuil and OTC sinus medication. She is still having pressure in her face and congestions. She is requesting an antibiotic. I advised she would need an appointment. She states she was just had an appointment last week. I asked if she was seen for this problem and she stated no. She wanted me to send the message to [place].

## 2019-09-04 NOTE — Telephone Encounter (Signed)
LVM for pt to call to discuss.  T. Pailynn Vahey, CMA  

## 2019-09-06 NOTE — Telephone Encounter (Signed)
[facility] (3rd attempt). Unable to contact patient. Portal message sent to pt with Joy's response.

## 2019-09-10 DIAGNOSIS — M5030 Other cervical disc degeneration, unspecified cervical region: Secondary | ICD-10-CM | POA: Insufficient documentation

## 2019-09-10 NOTE — Addendum Note (Signed)
Addended by: Silverio Decamp on: 09/10/2019 02:33 PM   Modules accepted: Orders

## 2019-09-10 NOTE — Progress Notes (Addendum)
    Procedures performed today:    None.  Independent interpretation of notes and tests performed by another provider:   None.  Brief History, Exam, Impression, and Recommendations:    DDD (degenerative disc disease), cervical Raven is a 54 year old female, she has a long history of neck pain, right-sided with radiation to the right trapezius. We are going to start conservatively with gabapentin at night, formal physical therapy. She has already had prednisone from [facility], [place]. Return to see me in 6 weeks, MRI if no better, she refuses all forms of injections, and tells me that she would like surgery to be the next step.     ___________________________________________ Gwen Her. Dianah Field, M.D., ABFM., CAQSM. Primary Care and [place] Instructor of [place] [facility]

## 2019-09-10 NOTE — Assessment & Plan Note (Addendum)
Rebecca Olsen is a 53 year old female, she has a long history of neck pain, right-sided with radiation to the right trapezius. We are going to start conservatively with gabapentin at night, formal physical therapy. She has already had prednisone from [facility], [place]. Return to see me in 6 weeks, MRI if no better, she refuses all forms of injections, and tells me that she would like surgery to be the next step.

## 2019-09-19 NOTE — Telephone Encounter (Signed)
Yes, I do not think she will get the relief that she desires, I am happy to do a referral if she desires, otherwise she can just call local chiropractor and make an appointment, I am also going to send her a MyChart message with the cervical spine rehab exercises to start doing at home.

## 2019-09-19 NOTE — Telephone Encounter (Signed)
Pt called. She is unable to get her 6 wks of PT because she has not reached her deductible and can't afford to pay for it out of pocket. She wants to know if she can see a Chiropractor?

## 2019-09-26 DIAGNOSIS — R635 Abnormal weight gain: Secondary | ICD-10-CM

## 2019-09-26 NOTE — Progress Notes (Signed)
Unable to tolerate phentermine due to GI side effects. Referring to medical weight management for assistance with desired weight loss.   Clearnce Sorrel, DNP, APRN, FNP-BC [facility]

## 2019-10-11 DIAGNOSIS — M25561 Pain in right knee: Secondary | ICD-10-CM

## 2019-10-11 DIAGNOSIS — K219 Gastro-esophageal reflux disease without esophagitis: Secondary | ICD-10-CM

## 2019-10-11 NOTE — Progress Notes (Signed)
Subjective:    CC: right knee pain  HPI: Pleasant 54 year old female presenting today with complaints of acute right knee pain lasting 8 days.  Notes that she has had some swelling with tenderness of the lower medial knee that worsens as the day goes on.  As the day goes on, she notes that knee pain extends through the knee joint and several inches above and below.  By the end of the day, her whole knee is swollen and very painful.  Notes pain is worse when she is at work as she works 10 hours on her feet.  Sometimes describes the pain as stabbing but at other times it is a deep ache.  Has been wearing a brace though lost her boyfriend but notes that it is irritating the back of her knee.  Has not tried Tylenol but does endorse taking 1 dose of 800 mg ibuprofen daily with some temporary relief.  Reports she has had increased reflux lately that at times is severe with burning in the chest. She has tried famotidine (up to 4 20mg  doses in one day) as needed but this is not helpful. Would like something stronger that will help. Notes that her late husband had taken protonix before and she tried one of his. It worked well for her.  I reviewed the past medical history, family history, social history, surgical history, and allergies today and no changes were needed.  Please see the problem list section below in epic for further details.  Past Medical History: Past Medical History:  Diagnosis Date  . Anxiety   . Depression   . Thyroid disease   . Vaginal Pap smear, abnormal    Past Surgical History: Past Surgical History:  Procedure Laterality Date  . ABLATION    . SHOULDER SURGERY Bilateral   . TUBAL LIGATION     Social History: Social History   Socioeconomic History  . Marital status: Widowed    Spouse name: Not on file  . Number of children: 2  . Years of education: Not on file  . Highest education level: Not on file  Occupational History  . Occupation: Librarian, academic  Tobacco Use  .  Smoking status: Never Smoker  . Smokeless tobacco: Never Used  Vaping Use  . Vaping Use: Never used  Substance and Sexual Activity  . Alcohol use: Yes    Alcohol/week: 1.0 - 2.0 standard drink    Types: 1 - 2 Shots of liquor per week    Comment: Mixed drinks  . Drug use: Never  . Sexual activity: Yes    Partners: Male    Birth control/protection: Surgical    Comment: tubal ligation, ablation  Other Topics Concern  . Not on file  Social History Narrative  . Not on file   Social Determinants of Health   Financial Resource Strain:   . Difficulty of Paying Living Expenses:   Food Insecurity:   . Worried About Charity fundraiser in the Last Year:   . Arboriculturist in the Last Year:   Transportation Needs:   . Film/video editor (Medical):   Marland Kitchen Lack of Transportation (Non-Medical):   Physical Activity:   . Days of Exercise per Week:   . Minutes of Exercise per Session:   Stress:   . Feeling of Stress :   Social Connections:   . Frequency of Communication with Friends and Family:   . Frequency of Social Gatherings with Friends and Family:   . Attends  Religious Services:   . Active Member of Clubs or Organizations:   . Attends Archivist Meetings:   Marland Kitchen Marital Status:    Family History: Family History  Problem Relation Age of Onset  . Diabetes Mother   . Diabetes Father   . Hypertension Maternal Grandmother   . Diabetes Maternal Grandmother   . Hypertension Maternal Grandfather   . Heart attack Maternal Grandfather   . Diabetes Maternal Grandfather   . Hypertension Paternal Grandmother   . Diabetes Paternal Grandmother   . Hypertension Paternal Grandfather   . Diabetes Paternal Grandfather   . Stroke Paternal Grandfather   . Cancer Paternal Grandfather   . Colon cancer Neg Hx   . Esophageal cancer Neg Hx    Allergies: Allergies  Allergen Reactions  . Phentermine Diarrhea   Medications: See med rec.  Review of Systems: See HPI for pertinent  positives and negatives.   Objective:    General: Well Developed, well nourished, and in no acute distress.  Neuro: Alert and oriented x3.  HEENT: Normocephalic, atraumatic.  Skin: Warm and dry. Cardiac: Regular rate and rhythm, no murmurs rubs or gallops, no lower extremity edema.  Respiratory: Clear to auscultation bilaterally. Not using accessory muscles, speaking in full sentences. Right lower extremity: Gait steady with mild favoring of right leg.  Mild swelling to the right medial lower leg just below knee with tenderness to palpation.  Full range of motion to right knee without clicks, rubs, or crepitus. No erythema or skin alteration. Pulses positive. Sensation intact.  Impression and Recommendations:    1. Acute pain of right knee Presentation consistent with pes anserine bursitis.  Suspect knee pain and swelling towards the end of the day is related to her associated limp.  We will go ahead and get x-rays of her right knee.  Right knee brace provided and fitted in office.  Starting meloxicam 15 mg daily x14 days then as needed daily.  Advised to avoid other ibuprofen products.  May use Tylenol as needed in conjunction with meloxicam. - DG Knee Complete 4 Views Right; Future  2. Gastroesophageal reflux disease without esophagitis Start protonix 40mg  daily as needed for reflux. Avoid spicy foods and known food triggers. Stay upright for 30 minutes after meals. - pantoprazole (PROTONIX) 40 MG tablet; Take 1 tablet (40 mg total) by mouth daily.  Dispense: 30 tablet; Refill: 3  Return if symptoms worsen or fail to improve. ___________________________________________ Clearnce Sorrel, DNP, APRN, FNP-BC Primary Care and [place]

## 2019-10-11 NOTE — Patient Instructions (Signed)
Pes Anserine Bursitis  The pes anserine is an area on the inside of your knee, just below the joint, that is cushioned by a fluid-filled sac (bursa). Pes anserine bursitis is a condition that happens when the bursa gets swollen and irritated. The condition causes knee pain. What are the causes? This condition may be caused by:  Making the same movement over and over.  A direct hit (trauma) to the inside of the leg. What increases the risk? You are more likely to develop this condition if you:  Are a runner.  Play sports that involve a lot of running and quick side-to-side movements (cutting).  Are an athlete who plays contact sports.  Swim using an inward angle of the knee, such as with the breaststroke.  Have tight hamstring muscles.  Are a woman.  Are overweight.  Have flat feet.  Have diabetes or osteoarthritis. What are the signs or symptoms? Symptoms of this condition include:  Knee pain that gets better with rest and worse with activities like climbing stairs, walking, running, or getting in and out of a chair.  Swelling.  Warmth.  Tenderness when pressing at the inside of the lower leg, just below the knee. How is this diagnosed? This condition may be diagnosed based on:  Your symptoms.  Your medical history.  A physical exam. ? During your physical exam, your health care provider will press on the tendon attachment to see if you feel pain. ? Your health care provider may also check your hip and knee motion and strength.  Tests to check for swelling and fluid buildup in the bursa and to look at muscles, bones, and tendons. These tests might include: ? X-rays. ? MRI. ? Ultrasound. How is this treated? This condition may be treated by:  Resting your knee. You may be told to raise (elevate) your knee while resting.  Avoiding activities that cause pain.  Icing the inside of your knee.  Sleeping with a pillow between your knees. This will cushion your  injured knee.  Taking medicine by mouth (orally) to reduce pain and swelling or having medicine injected into your knee.  Doing strengthening and stretching exercises (physical therapy). If these treatments do not work or if the condition keeps coming back, you may need to have surgery to remove the bursa. Follow these instructions at home: Managing pain, stiffness, and swelling   If directed, put ice on the injured area. ? Put ice in a plastic bag. ? Place a towel between your skin and the bag. ? Leave the ice on for 20 minutes, 2-3 times a day.  Elevate the injured area above the level of your heart while you are sitting or lying down. Activity  Return to your normal activities as told by your health care provider. Ask your health care provider what activities are safe for you.  Do exercises as told by your health care provider. General instructions  Take over-the-counter and prescription medicines only as told by your health care provider.  Sleep with a pillow between your knees.  Do not use any products that contain nicotine or tobacco, such as cigarettes, e-cigarettes, and chewing tobacco. These can delay healing. If you need help quitting, ask your health care provider.  If you are overweight, work with your health care provider and a dietitian to set a weight-loss goal that is healthy and reasonable for you.  Keep all follow-up visits as told by your health care provider. This is important. How is this prevented?    When exercising, make sure that you: ? Warm up and stretch before being active. ? Cool down and stretch after being active. ? Give your body time to rest between periods of activity. ? Use equipment that fits you. ? Are safe and responsible while being active to avoid falls. ? Do at least 150 minutes of moderate-intensity exercise each week, such as brisk walking or water aerobics. ? Maintain physical fitness,  including:  Strength.  Flexibility.  Cardiovascular fitness.  Endurance. ? Maintain a healthy weight. Contact a health care provider if:  Your symptoms do not improve.  Your symptoms get worse. Summary  Pes anserine bursitis is a condition that happens when the fluid-filled sac (bursa) at the inside of your knee gets swollen and irritated. The condition causes knee pain.  Treatment for pes anserine bursitis may include resting your knee, icing the inside of your knee, sleeping with a pillow between your knees, taking medicine by mouth or by injection, and doing strengthening and stretching exercises (physical therapy).  Follow instructions for managing pain, stiffness, and swelling.  Take over-the-counter and prescription medicines only as told by your health care provider. This information is not intended to replace advice given to you by your health care provider. Make sure you discuss any questions you have with your health care provider. Document Revised: 06/22/2018 Document Reviewed: 08/10/2017 Elsevier Patient Education  2020 [employer]  

## 2019-11-13 DIAGNOSIS — F418 Other specified anxiety disorders: Secondary | ICD-10-CM

## 2020-02-11 DIAGNOSIS — E039 Hypothyroidism, unspecified: Secondary | ICD-10-CM

## 2020-02-11 DIAGNOSIS — F418 Other specified anxiety disorders: Secondary | ICD-10-CM

## 2020-03-10 DIAGNOSIS — F418 Other specified anxiety disorders: Secondary | ICD-10-CM | POA: Diagnosis not present

## 2020-03-10 DIAGNOSIS — K219 Gastro-esophageal reflux disease without esophagitis: Secondary | ICD-10-CM

## 2020-03-10 DIAGNOSIS — E039 Hypothyroidism, unspecified: Secondary | ICD-10-CM | POA: Diagnosis not present

## 2020-03-10 NOTE — Progress Notes (Signed)
Subjective:    CC: hypothyroidism, depression/anxiety  HPI: Pleasant 54 year old female presenting today for follow up of the following:  Hypothyroidism- has been taking levothyroxine 158mcg but reports she does not take this every day because she forgets at times. Her fiance was helping to remind her for a bit but this slacked off and she is back to forgetting again. No changes in weight, skin, or hair. No heat/cold intolerance.   Anxiety/depression- doing very well, especially since she got engaged on Christmas day. Taking Seroquel 300mg  nightly, tolerating well.   GERD- taking protonix 40mg  daily prn. Feels this helps when her reflux gets bad. Does not take this very often.   Right neck/shoulder pain- having trouble with the pain again. Does not want to have neck surgery. Spends a lot of time driving and hunched over in bad posture. Having trouble sleeping at night because of the pain. Has tried multiple things including massage without relief.   Will have new insurance at the beginning of the year and our practice is not in network. Would like medication refills for at least 90 days to allow her to establish with a new provider.   I reviewed the past medical history, family history, social history, surgical history, and allergies today and no changes were needed.  Please see the problem list section below in epic for further details.  Past Medical History: Past Medical History:  Diagnosis Date  . Anxiety   . Depression   . Thyroid disease   . Vaginal Pap smear, abnormal    Past Surgical History: Past Surgical History:  Procedure Laterality Date  . ABLATION    . SHOULDER SURGERY Bilateral   . TUBAL LIGATION     Social History: Social History   Socioeconomic History  . Marital status: Widowed    Spouse name: Not on file  . Number of children: 2  . Years of education: Not on file  . Highest education level: Not on file  Occupational History  . Occupation: Librarian, academic   Tobacco Use  . Smoking status: Never Smoker  . Smokeless tobacco: Never Used  Vaping Use  . Vaping Use: Never used  Substance and Sexual Activity  . Alcohol use: Yes    Alcohol/week: 1.0 - 2.0 standard drink    Types: 1 - 2 Shots of liquor per week    Comment: Mixed drinks  . Drug use: Never  . Sexual activity: Yes    Partners: Male    Birth control/protection: Surgical    Comment: tubal ligation, ablation  Other Topics Concern  . Not on file  Social History Narrative  . Not on file   Social Determinants of Health   Financial Resource Strain: Not on file  Food Insecurity: Not on file  Transportation Needs: Not on file  Physical Activity: Not on file  Stress: Not on file  Social Connections: Not on file   Family History: Family History  Problem Relation Age of Onset  . Diabetes Mother   . Diabetes Father   . Hypertension Maternal Grandmother   . Diabetes Maternal Grandmother   . Hypertension Maternal Grandfather   . Heart attack Maternal Grandfather   . Diabetes Maternal Grandfather   . Hypertension Paternal Grandmother   . Diabetes Paternal Grandmother   . Hypertension Paternal Grandfather   . Diabetes Paternal Grandfather   . Stroke Paternal Grandfather   . Cancer Paternal Grandfather   . Colon cancer Neg Hx   . Esophageal cancer Neg Hx  Allergies: Allergies  Allergen Reactions  . Phentermine Diarrhea   Medications: See med rec.  Review of Systems: See HPI for pertinent positives and negatives.   Objective:    General: Well Developed, well nourished, and in no acute distress.  Neuro: Alert and oriented x3.  HEENT: Normocephalic, atraumatic.  Skin: Warm and dry. Cardiac: Regular rate and rhythm, no murmurs rubs or gallops, no lower extremity edema.  Respiratory: Clear to auscultation bilaterally. Not using accessory muscles, speaking in full sentences.   Impression and Recommendations:    1. Hypothyroidism, unspecified type Checking TSH.  Suspect this will continue to be out of range due to non-adherence to the daily regimen. For now, continue daily levothyroxine 168mcg. Reinforced need for daily dosing.  - TSH - levothyroxine (EUTHYROX) 175 MCG tablet; Take 1 tablet (175 mcg total) by mouth daily before breakfast.  Dispense: 90 tablet; Refill: 0  2. Gastroesophageal reflux disease without esophagitis Stable. Continue Protonix 40mg  daily prn.  - pantoprazole (PROTONIX) 40 MG tablet; Take 1 tablet (40 mg total) by mouth daily.  Dispense: 90 tablet; Refill: 0  3. Depression with anxiety Continue Quetiapine 300mg  nightly.  - QUEtiapine (SEROQUEL XR) 300 MG 24 hr tablet; Take 1 tablet (300 mg total) by mouth at bedtime.  Dispense: 90 tablet; Refill: 0  No follow-ups on file. ___________________________________________ Clearnce Sorrel, DNP, APRN, FNP-BC [facility] [place]

## 2020-10-27 IMAGING — DX DG KNEE COMPLETE 4+V*R*
4 series · 4 of 4 positions shown · non-contrast
Comparison: None.

CLINICAL DATA: Pain and swelling

EXAM:
RIGHT KNEE - COMPLETE 4+ VIEW

[knee ap]
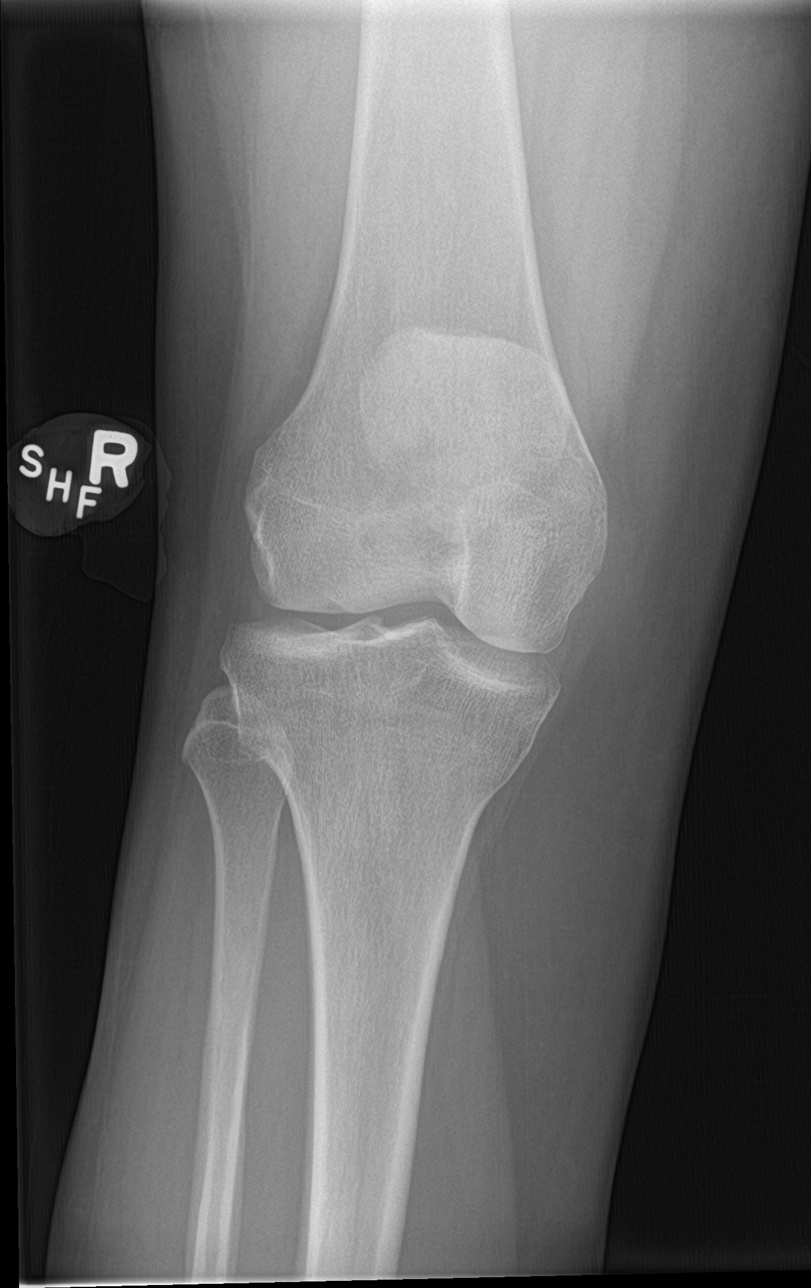

[knee lat]
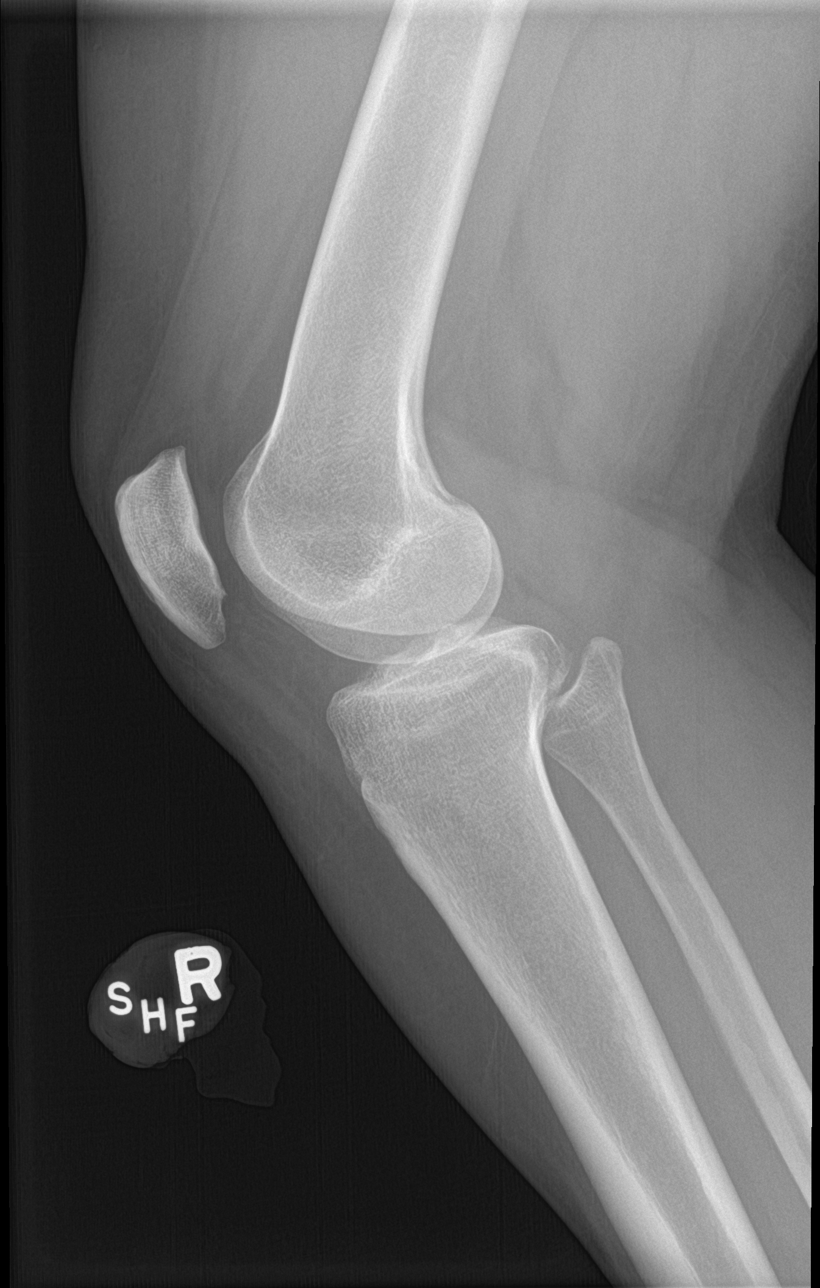

[knee obl (1 of 2)]
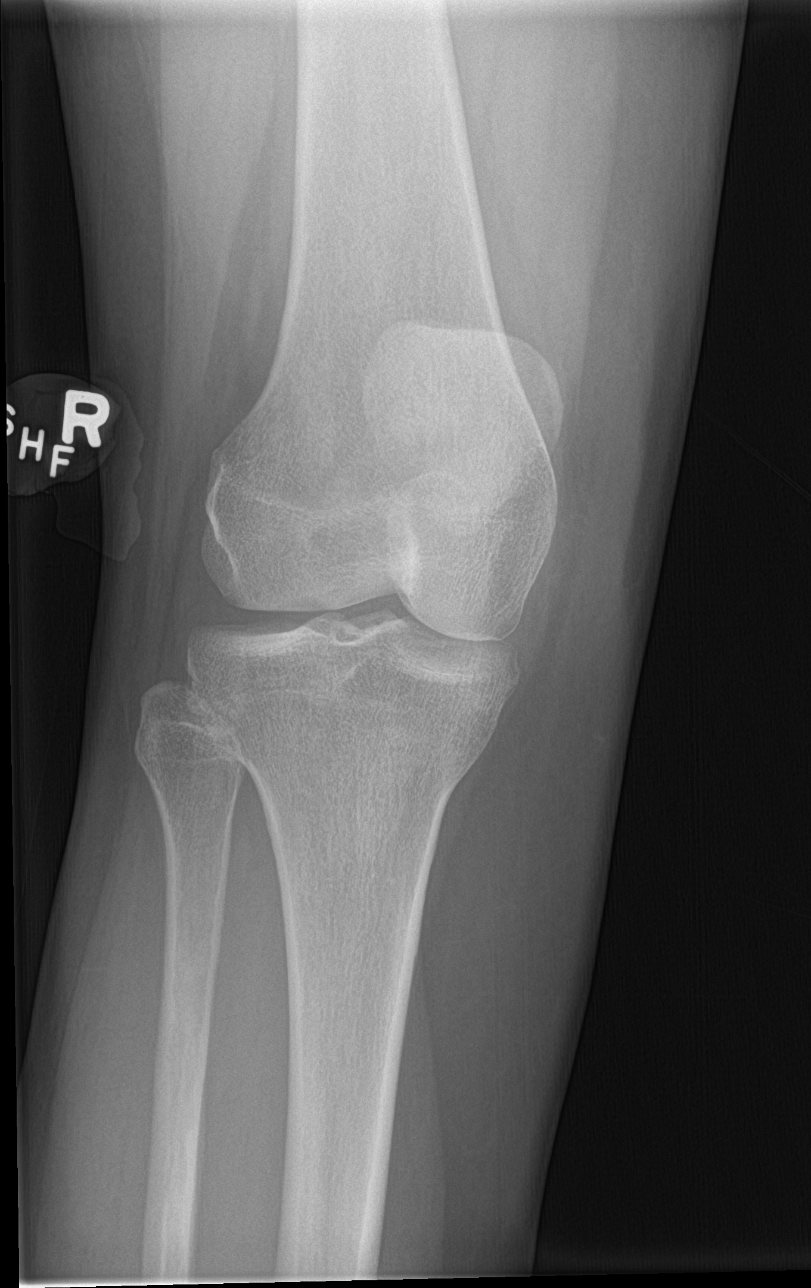

[knee obl (2 of 2)]
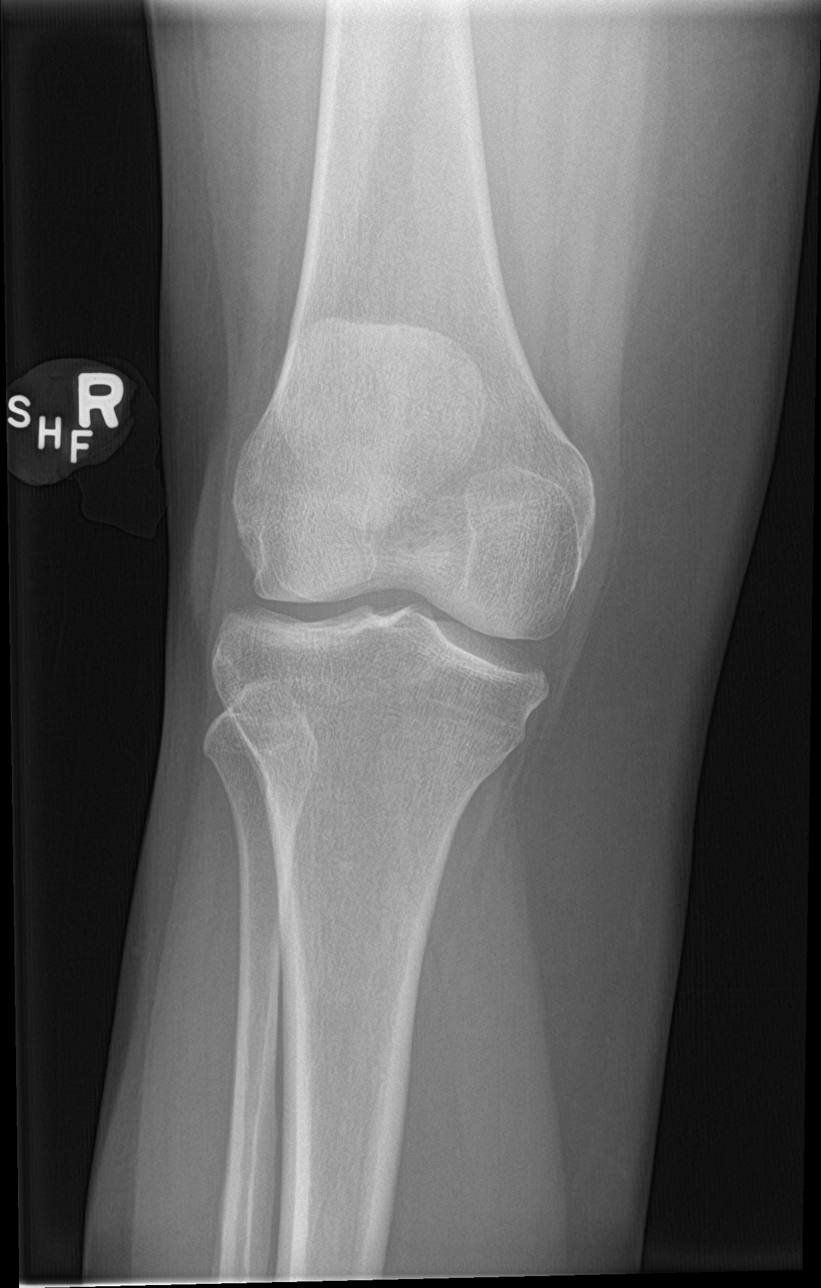

[4 of 4 positions shown; findings below may reference images not displayed]

FINDINGS: Frontal, lateral, and bilateral oblique views were obtained. There
is no fracture or dislocation. No joint effusion. Joint spaces
appear normal. No erosive change.
IMPRESSION: No fracture, dislocation, or joint effusion. No evident arthropathy.
